# Patient Record
Sex: Male | Born: 1955 | Race: White | Hispanic: No | Marital: Married | Smoking: Former smoker
Health system: Southern US, Community
[De-identification: ages and names within clinical notes are randomized; demographics above are authoritative.]

## PROBLEM LIST (undated history)

## (undated) DIAGNOSIS — B019 Varicella without complication: Secondary | ICD-10-CM

## (undated) DIAGNOSIS — J939 Pneumothorax, unspecified: Secondary | ICD-10-CM

## (undated) DIAGNOSIS — E785 Hyperlipidemia, unspecified: Secondary | ICD-10-CM

## (undated) HISTORY — DX: Hyperlipidemia, unspecified: E78.5

## (undated) HISTORY — DX: Pneumothorax, unspecified: J93.9

## (undated) HISTORY — DX: Varicella without complication: B01.9

---

## 2005-02-05 ENCOUNTER — Inpatient Hospital Stay: Payer: Self-pay | Admitting: Surgery

## 2006-09-01 ENCOUNTER — Observation Stay: Payer: Self-pay | Admitting: Surgery

## 2006-09-05 ENCOUNTER — Ambulatory Visit: Payer: Self-pay | Admitting: Surgery

## 2006-12-23 IMAGING — CR DG CHEST 1V PORT
1 series · 1 of 1 positions shown · non-contrast
Comparison: none

REASON FOR EXAM: Chest tube placement. [HOSPITAL]
COMMENTS:

[view not recorded]
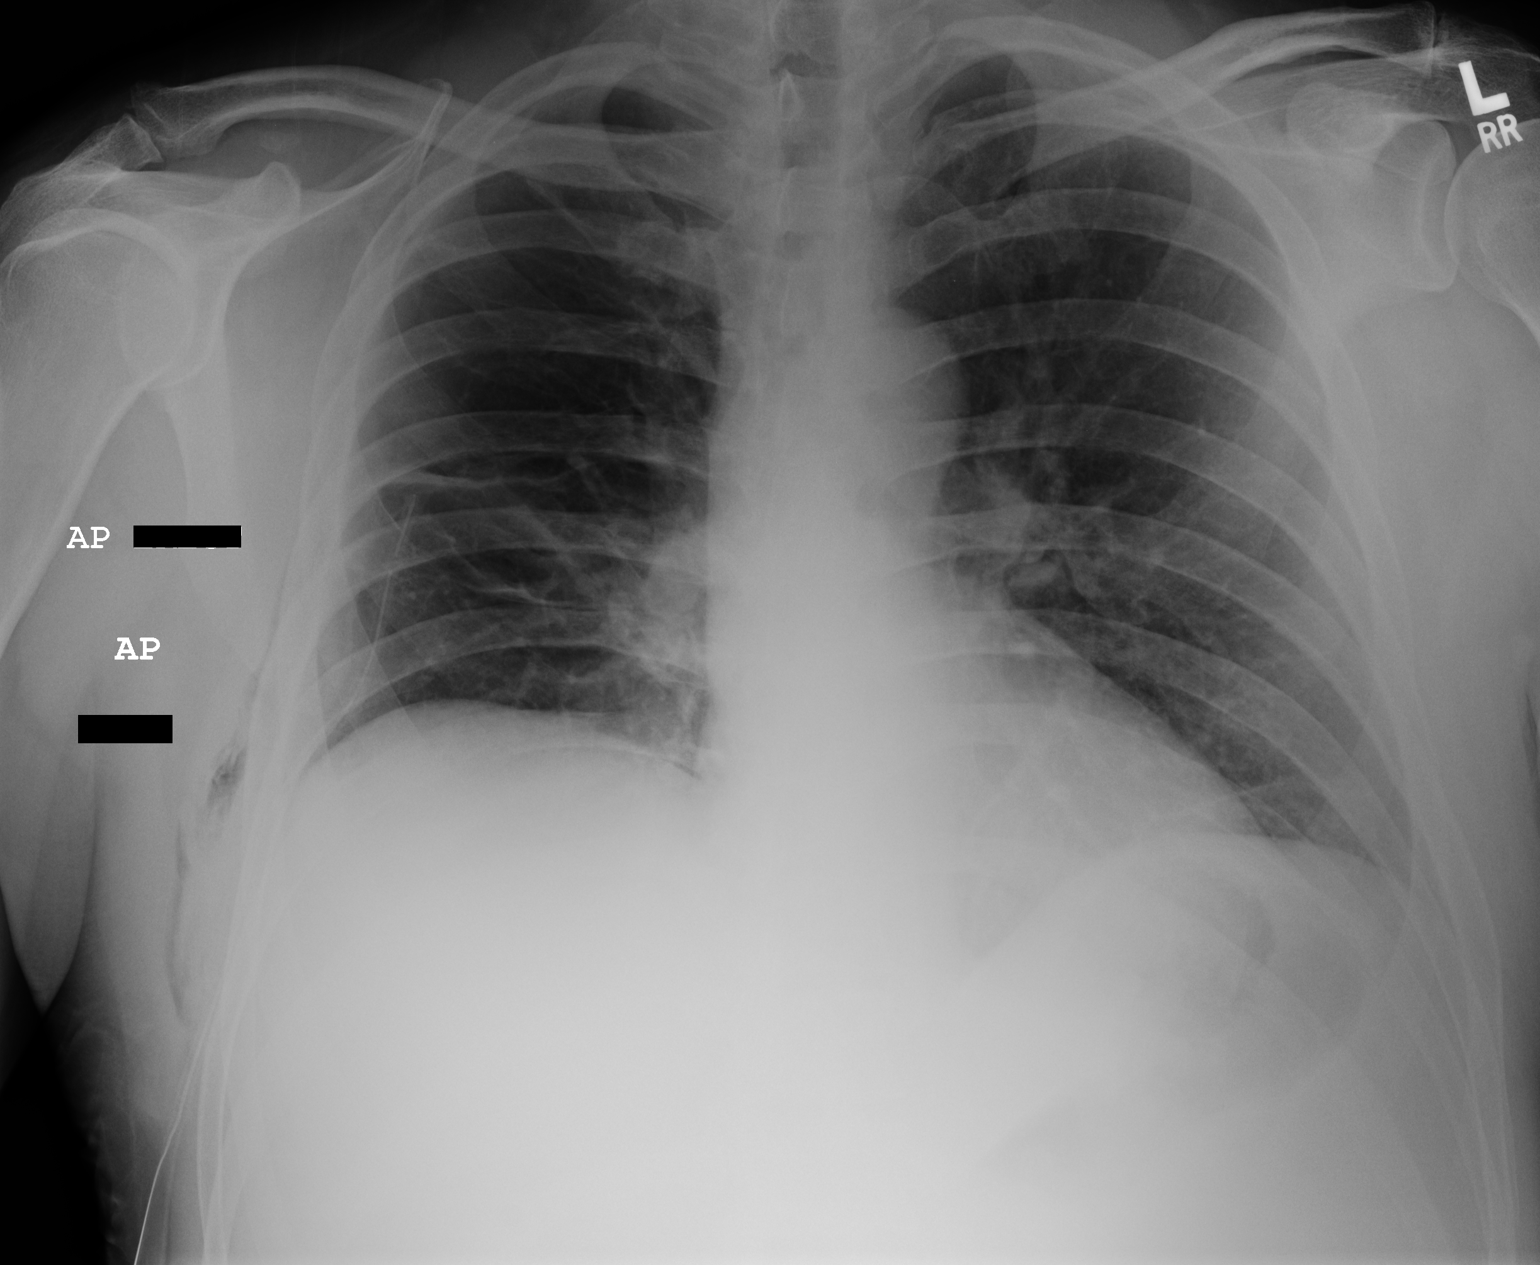

[1 of 1 positions shown; findings below may reference images not displayed]

PROCEDURE:     DXR - DXR PORTABLE CHEST SINGLE VIEW  - September 01, 2006  [DATE]

RESULT:     Note this study is labeled wrong on the images. The name on the
image should read [facility]el[facility].

The patient is taking a shallow inspiration.  A RIGHT-sided chest tube has
been placed within the RIGHT lower hemithorax.  There does not appear to be
evidence of an appreciable pneumothorax.  The patient is taking a shallow
inspiration.  No evidence of focal infiltrates, effusions, or edema is
identified.  Discoid atelectasis is demonstrated within the mid and lower
RIGHT hemithorax.  The cardiac silhouette and visualized bony skeleton are
unremarkable.
IMPRESSION: No evidence of a significant pneumothorax within the RIGHT hemithorax.

Discoid atelectasis versus scar within the RIGHT lung base.

RIGHT-sided chest tube.

## 2007-01-17 ENCOUNTER — Inpatient Hospital Stay: Payer: Self-pay | Admitting: Surgery

## 2007-01-27 ENCOUNTER — Ambulatory Visit: Payer: Self-pay | Admitting: Surgery

## 2007-05-10 IMAGING — CR DG CHEST 2V
1 series · 2 of 2 positions shown · non-contrast
Comparison: none

REASON FOR EXAM: PTX
COMMENTS:

[Series 1: view not recorded · 0.17mm/px · 2 of 2 slices shown]
[im 1/2]
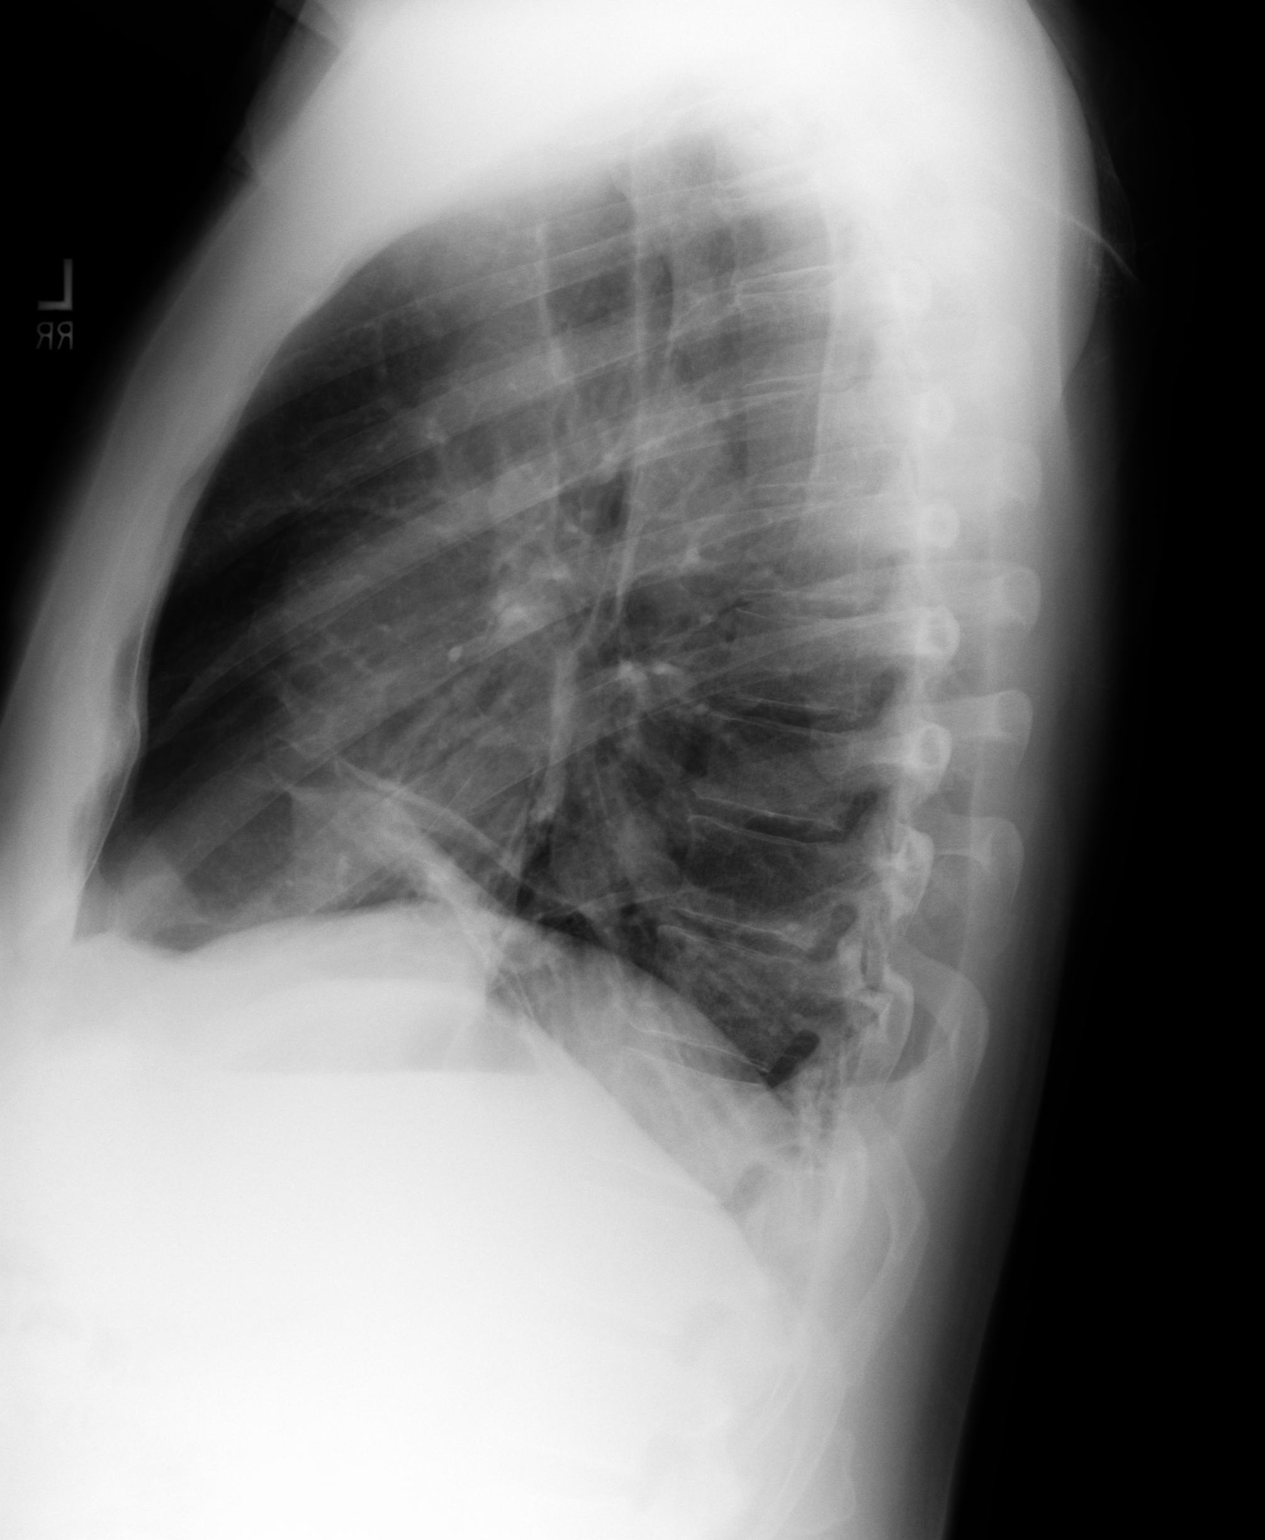
[im 2/2]
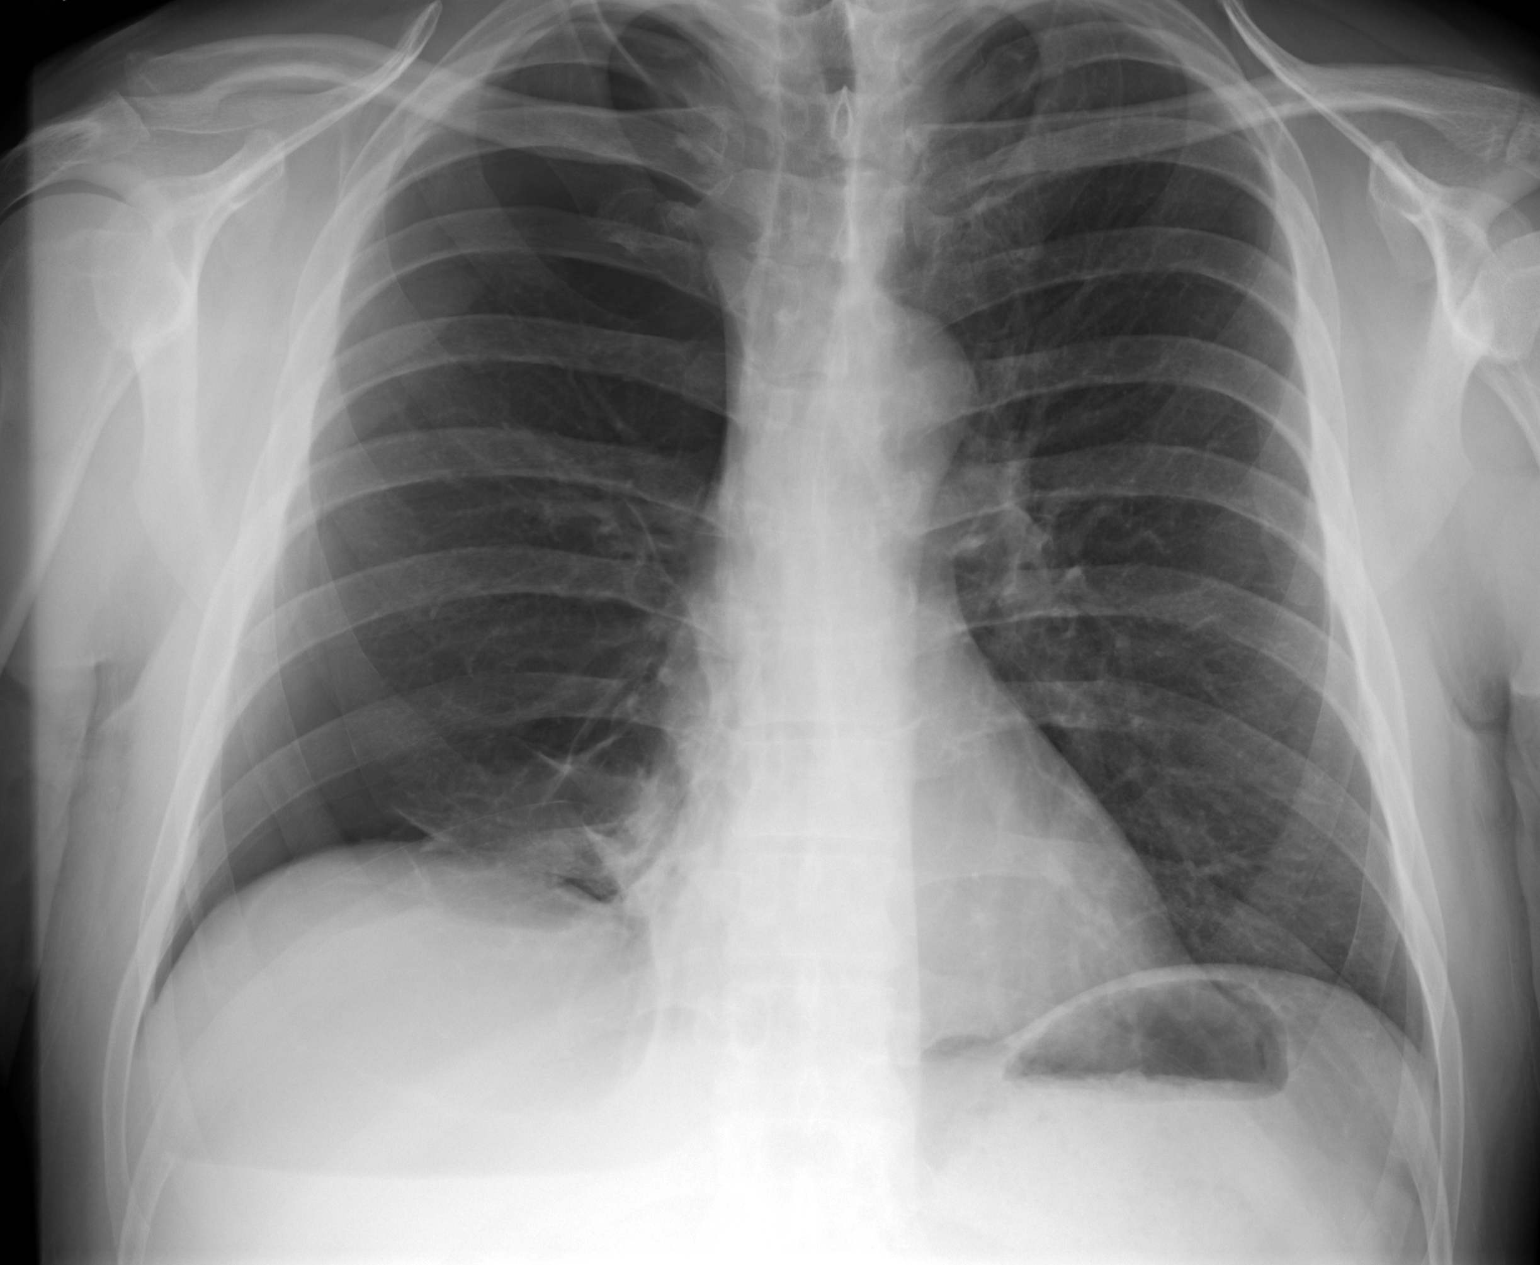

[2 of 2 positions shown; findings below may reference images not displayed]

PROCEDURE:     DXR - DXR CHEST PA (OR AP) AND LATERAL  - January 17, 2007  [DATE]

RESULT:     The current exam is compared to a prior exam of 09/05/06.  A
pneumothorax is observed on the RIGHT.  There is approximately 5 cm
separation of the visceral and parietal pleura at the RIGHT costophrenic
angle.  There is also approximately 5 cm separation of visceral and parietal
pleura at the RIGHT apex.  There is observed atelectasis at the RIGHT lung
base.  The LEFT lung field is clear.  Heart size is normal.
IMPRESSION: There is observed pneumothorax on the RIGHT as described above.

The LEFT lung field is clear.

## 2007-05-20 IMAGING — CR DG CHEST 2V
1 series · 2 of 2 positions shown · non-contrast
Comparison: none

REASON FOR EXAM: RECURRENT PNEUMOTHORAX W/CHEST TUBE PLACEMENT
COMMENTS:

[Series 1: view not recorded · 0.17mm/px · 2 of 2 slices shown]
[im 1/2]
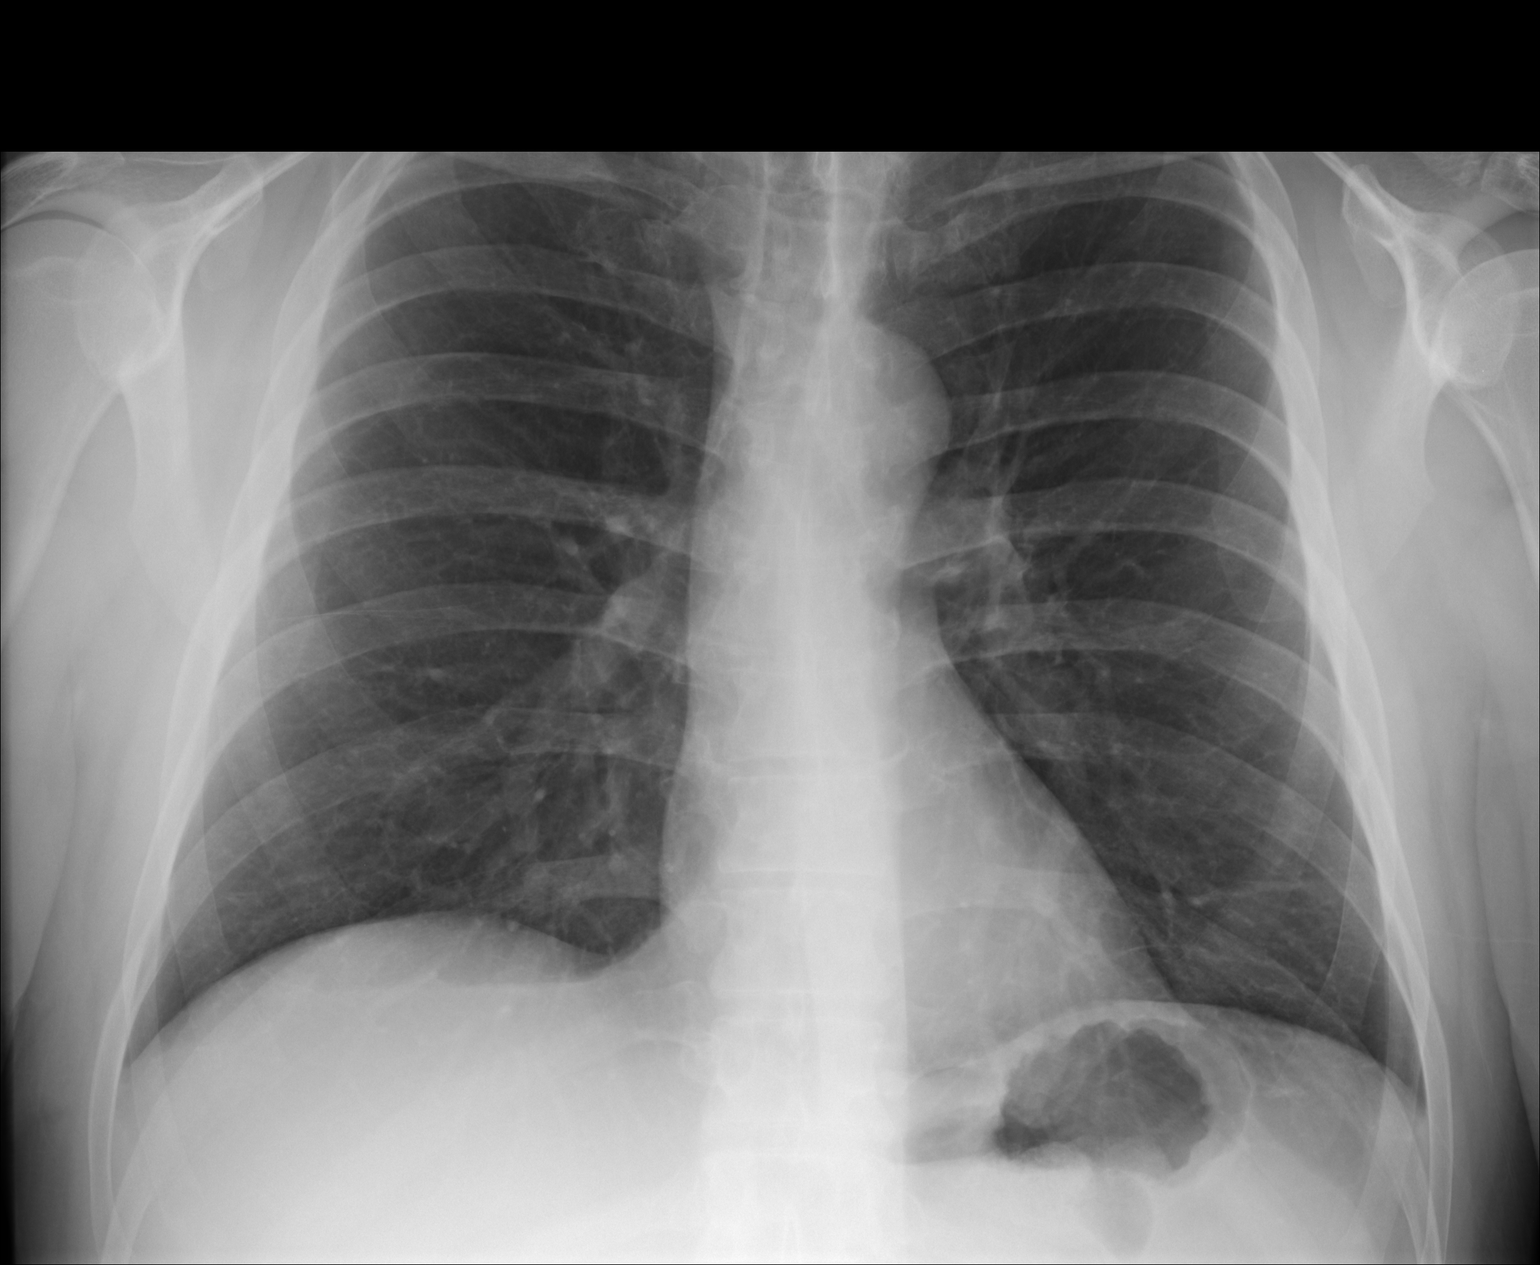
[im 2/2]
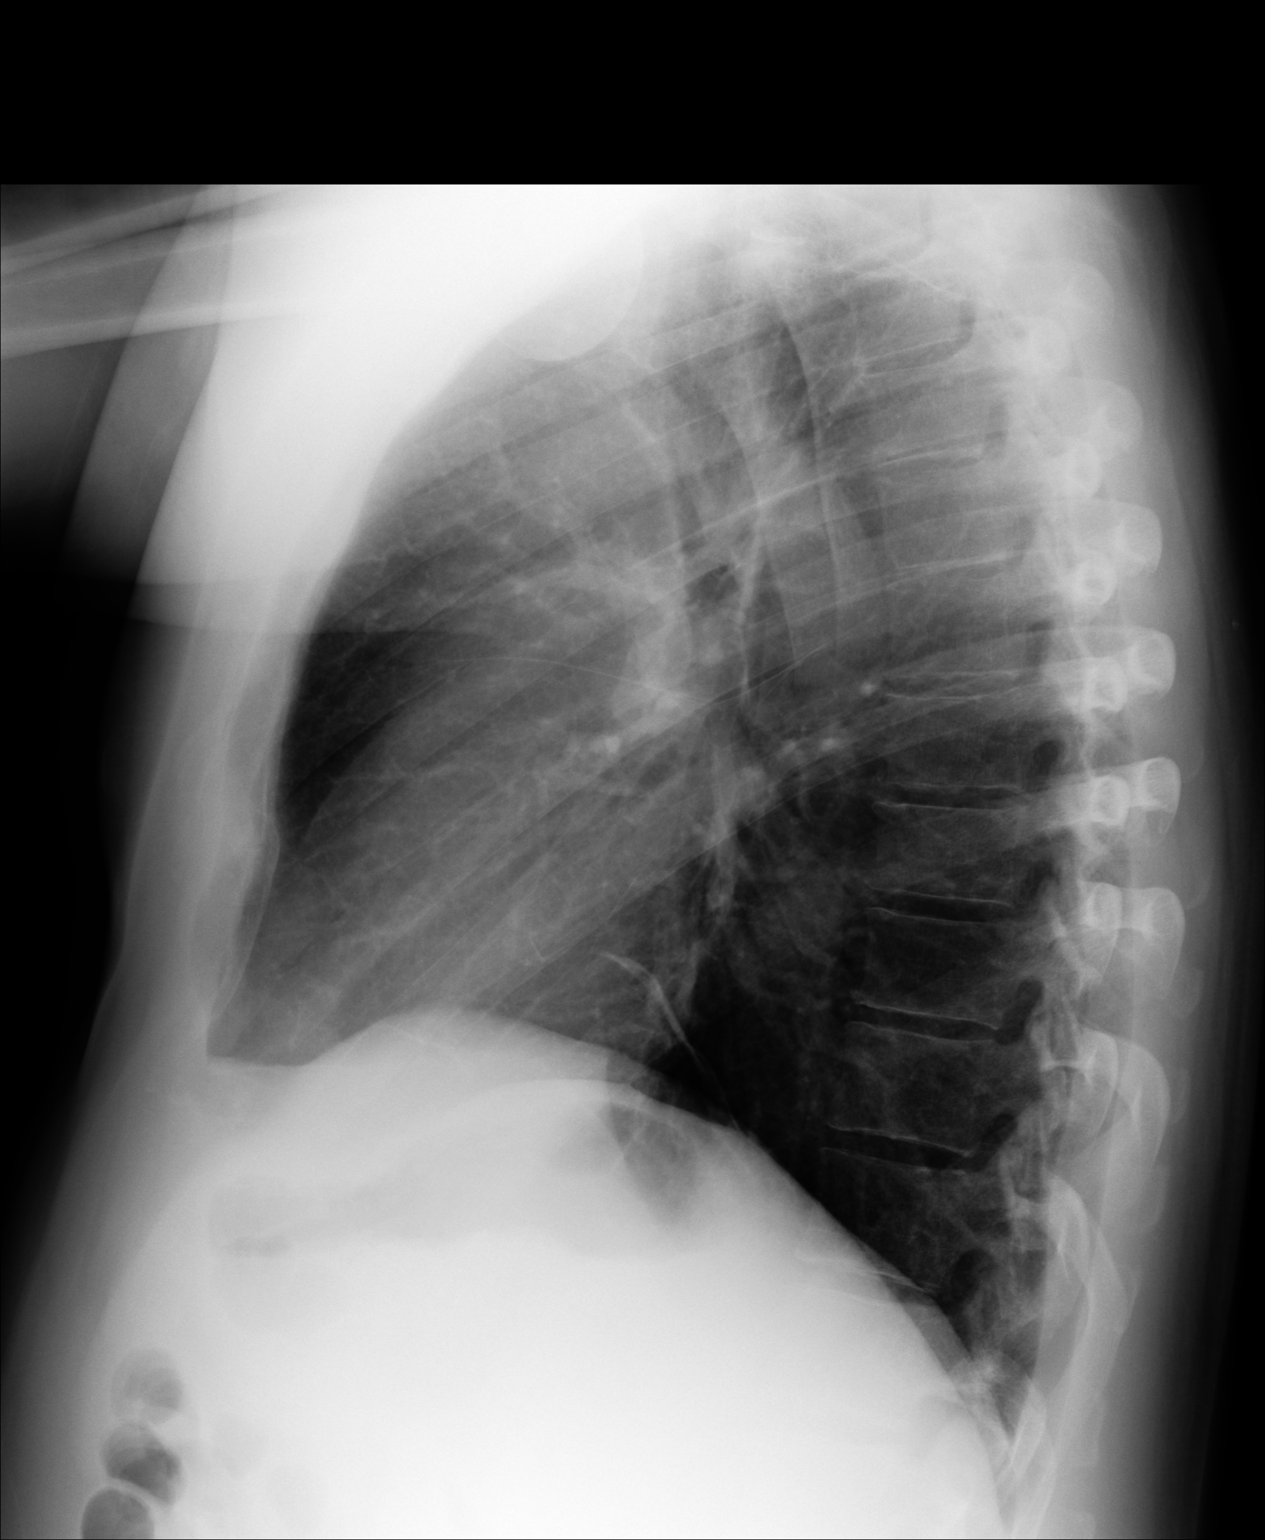

[2 of 2 positions shown; findings below may reference images not displayed]

PROCEDURE:     DXR - DXR CHEST PA (OR AP) AND LATERAL  - January 27, 2007 [DATE]

RESULT:     The patient has a history of recurrent pneumothoraces.  The
patient is undergoing reevaluation following placement of a chest tube on
01/17/07.

The lungs are well expanded.  I do not see evidence of recurrent
pneumothorax.  No definite chest tube is visible on today's study.
IMPRESSION: I do not see evidence of recurrence of the RIGHT-sided pneumothorax.  The
heart and mediastinal structures and LEFT lung are normal in appearance.

## 2017-09-12 ENCOUNTER — Encounter: Payer: Self-pay | Admitting: Family

## 2017-09-12 ENCOUNTER — Ambulatory Visit (INDEPENDENT_AMBULATORY_CARE_PROVIDER_SITE_OTHER): Payer: PRIVATE HEALTH INSURANCE | Admitting: Family

## 2017-09-12 VITALS — BP 140/84 | HR 67 | Temp 97.7°F | Wt 242.6 lb

## 2017-09-12 DIAGNOSIS — R030 Elevated blood-pressure reading, without diagnosis of hypertension: Secondary | ICD-10-CM | POA: Insufficient documentation

## 2017-09-12 DIAGNOSIS — Z7689 Persons encountering health services in other specified circumstances: Secondary | ICD-10-CM | POA: Diagnosis not present

## 2017-09-12 DIAGNOSIS — Z0000 Encounter for general adult medical examination without abnormal findings: Secondary | ICD-10-CM | POA: Insufficient documentation

## 2017-09-12 LAB — COMPREHENSIVE METABOLIC PANEL
ALBUMIN: 4.2 g/dL (ref 3.5–5.2)
ALK PHOS: 72 U/L (ref 39–117)
ALT: 19 U/L (ref 0–53)
AST: 16 U/L (ref 0–37)
BILIRUBIN TOTAL: 1.8 mg/dL — AB (ref 0.2–1.2)
BUN: 21 mg/dL (ref 6–23)
CALCIUM: 9.4 mg/dL (ref 8.4–10.5)
CO2: 27 meq/L (ref 19–32)
CREATININE: 0.91 mg/dL (ref 0.40–1.50)
Chloride: 104 mEq/L (ref 96–112)
GFR: 90.22 mL/min (ref >60.00)
Glucose, Bld: 98 mg/dL (ref 70–99)
Potassium: 4.4 mEq/L (ref 3.5–5.1)
Sodium: 138 mEq/L (ref 135–145)
Total Protein: 6.7 g/dL (ref 6.0–8.3)

## 2017-09-12 LAB — CBC WITH DIFFERENTIAL/PLATELET
BASOS ABS: 0 10*3/uL (ref 0.0–0.1)
BASOS PCT: 0.6 % (ref 0.0–3.0)
Eosinophils Absolute: 0.2 10*3/uL (ref 0.0–0.7)
Eosinophils Relative: 2.6 % (ref 0.0–5.0)
HEMATOCRIT: 47.9 % (ref 39.0–52.0)
HEMOGLOBIN: 15.8 g/dL (ref 13.0–17.0)
LYMPHS PCT: 21.5 % (ref 12.0–46.0)
Lymphs Abs: 1.3 10*3/uL (ref 0.7–4.0)
MCHC: 32.9 g/dL (ref 30.0–36.0)
MCV: 93 fl (ref 78.0–100.0)
MONOS PCT: 9.6 % (ref 3.0–12.0)
Monocytes Absolute: 0.6 10*3/uL (ref 0.1–1.0)
NEUTROS ABS: 4 10*3/uL (ref 1.4–7.7)
Neutrophils Relative %: 65.7 % (ref 43.0–77.0)
PLATELETS: 241 10*3/uL (ref 150.0–400.0)
RBC: 5.15 Mil/uL (ref 4.22–5.81)
RDW: 13.7 % (ref 11.5–15.5)
WBC: 6.1 10*3/uL (ref 4.0–10.5)

## 2017-09-12 LAB — LIPID PANEL
CHOL/HDL RATIO: 4
CHOLESTEROL: 241 mg/dL — AB (ref 0–200)
HDL: 54.5 mg/dL (ref >39.00)
NonHDL: 186.44
TRIGLYCERIDES: 283 mg/dL — AB (ref 0.0–149.0)
VLDL: 56.6 mg/dL — ABNORMAL HIGH (ref 0.0–40.0)

## 2017-09-12 LAB — VITAMIN D 25 HYDROXY (VIT D DEFICIENCY, FRACTURES): VITD: 25.25 ng/mL — AB (ref 30.00–100.00)

## 2017-09-12 LAB — TSH: TSH: 2.2 u[IU]/mL (ref 0.35–4.50)

## 2017-09-12 LAB — PSA: PSA: 0.43 ng/mL (ref 0.10–4.00)

## 2017-09-12 LAB — HEMOGLOBIN A1C: HEMOGLOBIN A1C: 5.2 % (ref 4.6–6.5)

## 2017-09-12 LAB — LDL CHOLESTEROL, DIRECT: LDL DIRECT: 135 mg/dL

## 2017-09-12 NOTE — Assessment & Plan Note (Signed)
CPE labs ordered. Will return for cpe. Reviewed PMH.

## 2017-09-12 NOTE — Patient Instructions (Signed)
Labs today  Monitor blood pressure,  Goal is less than 130/80; if persistently higher, please make sooner follow up appointment so we can recheck you blood pressure and manage medications  Return for physical exam   Managing Your Hypertension Hypertension is commonly called high blood pressure. This is when the force of your blood pressing against the walls of your arteries is too strong. Arteries are blood vessels that carry blood from your heart throughout your body. Hypertension forces the heart to work harder to pump blood, and may cause the arteries to become narrow or stiff. Having untreated or uncontrolled hypertension can cause heart attack, stroke, kidney disease, and other problems. What are blood pressure readings? A blood pressure reading consists of a higher number over a lower number. Ideally, your blood pressure should be below 120/80. The first ("top") number is called the systolic pressure. It is a measure of the pressure in your arteries as your heart beats. The second ("bottom") number is called the diastolic pressure. It is a measure of the pressure in your arteries as the heart relaxes. What does my blood pressure reading mean? Blood pressure is classified into four stages. Based on your blood pressure reading, your health care provider may use the following stages to determine what type of treatment you need, if any. Systolic pressure and diastolic pressure are measured in a unit called mm Hg. Normal  Systolic pressure: below 120.  Diastolic pressure: below 80. Elevated  Systolic pressure: 120-129.  Diastolic pressure: below 80. Hypertension stage 1  Systolic pressure: 130-139.  Diastolic pressure: 80-89. Hypertension stage 2  Systolic pressure: 140 or above.  Diastolic pressure: 90 or above. What health risks are associated with hypertension? Managing your hypertension is an important responsibility. Uncontrolled hypertension can lead to:  A heart  attack.  A stroke.  A weakened blood vessel (aneurysm).  Heart failure.  Kidney damage.  Eye damage.  Metabolic syndrome.  Memory and concentration problems.  What changes can I make to manage my hypertension? Hypertension can be managed by making lifestyle changes and possibly by taking medicines. Your health care provider will help you make a plan to bring your blood pressure within a normal range. Eating and drinking  Eat a diet that is high in fiber and potassium, and low in salt (sodium), added sugar, and fat. An example eating plan is called the DASH (Dietary Approaches to Stop Hypertension) diet. To eat this way: ? Eat plenty of fresh fruits and vegetables. Try to fill half of your plate at each meal with fruits and vegetables. ? Eat whole grains, such as whole wheat pasta, brown rice, or whole grain bread. Fill about one quarter of your plate with whole grains. ? Eat low-fat diary products. ? Avoid fatty cuts of meat, processed or cured meats, and poultry with skin. Fill about one quarter of your plate with lean proteins such as fish, chicken without skin, beans, eggs, and tofu. ? Avoid premade and processed foods. These tend to be higher in sodium, added sugar, and fat.  Reduce your daily sodium intake. Most people with hypertension should eat less than 1,500 mg of sodium a day.  Limit alcohol intake to no more than 1 drink a day for nonpregnant women and 2 drinks a day for men. One drink equals 12 oz of beer, 5 oz of wine, or 1 oz of hard liquor. Lifestyle  Work with your health care provider to maintain a healthy body weight, or to lose weight. Ask what  an ideal weight is for you.  Get at least 30 minutes of exercise that causes your heart to beat faster (aerobic exercise) most days of the week. Activities may include walking, swimming, or biking.  Include exercise to strengthen your muscles (resistance exercise), such as weight lifting, as part of your weekly exercise  routine. Try to do these types of exercises for 30 minutes at least 3 days a week.  Do not use any products that contain nicotine or tobacco, such as cigarettes and e-cigarettes. If you need help quitting, ask your health care provider.  Control any long-term (chronic) conditions you have, such as high cholesterol or diabetes. Monitoring  Monitor your blood pressure at home as told by your health care provider. Your personal target blood pressure may vary depending on your medical conditions, your age, and other factors.  Have your blood pressure checked regularly, as often as told by your health care provider. Working with your health care provider  Review all the medicines you take with your health care provider because there may be side effects or interactions.  Talk with your health care provider about your diet, exercise habits, and other lifestyle factors that may be contributing to hypertension.  Visit your health care provider regularly. Your health care provider can help you create and adjust your plan for managing hypertension. Will I need medicine to control my blood pressure? Your health care provider may prescribe medicine if lifestyle changes are not enough to get your blood pressure under control, and if:  Your systolic blood pressure is 130 or higher.  Your diastolic blood pressure is 80 or higher.  Take medicines only as told by your health care provider. Follow the directions carefully. Blood pressure medicines must be taken as prescribed. The medicine does not work as well when you skip doses. Skipping doses also puts you at risk for problems. Contact a health care provider if:  You think you are having a reaction to medicines you have taken.  You have repeated (recurrent) headaches.  You feel dizzy.  You have swelling in your ankles.  You have trouble with your vision. Get help right away if:  You develop a severe headache or confusion.  You have unusual  weakness or numbness, or you feel faint.  You have severe pain in your chest or abdomen.  You vomit repeatedly.  You have trouble breathing. Summary  Hypertension is when the force of blood pumping through your arteries is too strong. If this condition is not controlled, it may put you at risk for serious complications.  Your personal target blood pressure may vary depending on your medical conditions, your age, and other factors. For most people, a normal blood pressure is less than 120/80.  Hypertension is managed by lifestyle changes, medicines, or both. Lifestyle changes include weight loss, eating a healthy, low-sodium diet, exercising more, and limiting alcohol. This information is not intended to replace advice given to you by your health care provider. Make sure you discuss any questions you have with your health care provider. Document Released: 09/10/2012 Document Revised: 11/14/2016 Document Reviewed: 11/14/2016 Elsevier Interactive Patient Education  [employer]2018 [employer]

## 2017-09-12 NOTE — Progress Notes (Signed)
Subjective:    Patient ID: Edwin Parker, male    DOB: [birth year], 61 y.o.   MRN: [id number]  CC: Edwin Parker is a 61 y.o. male who presents today to establish care.    HPI: Hasn't had pcp in 2 years.   Feeling well.   No h/o HTN. Eats a lot of salt. Denies exertional chest pain or pressure, numbness or tingling radiating to left arm or jaw, palpitations, dizziness, frequent headaches, changes in vision, or shortness of breath.   No smoking- quit when 61 years old  4-5 beers per day. No one has ever asked him to cut back. Doesn't drink an eye opening.    Colonoscopy 2 years ago, per patient due 2021  HISTORY:  Past Medical History:  Diagnosis Date  . Chicken pox   . Hyperlipidemia   . Pneumothorax 25366   History reviewed. No pertinent surgical history. Family History  Problem Relation Age of Onset  . Hyperlipidemia Mother   . Heart disease Mother   . Alcohol abuse Father   . Early death Brother        cancer  . Cancer Maternal Grandmother        ovarian/breast    Allergies: Patient has no allergy information on record. No current outpatient prescriptions on file prior to visit.   No current facility-administered medications on file prior to visit.     Social History  Substance Use Topics  . Smoking status: Former Games developer  . Smokeless tobacco: Former Neurosurgeon  . Alcohol use Yes    Review of Systems  Constitutional: Negative for chills and fever.  Respiratory: Negative for cough and shortness of breath.   Cardiovascular: Negative for chest pain and palpitations.  Gastrointestinal: Negative for nausea and vomiting.  Neurological: Negative for headaches.      Objective:    BP 140/84   Pulse 67   Temp 97.7 F (36.5 C) (Oral)   Wt 242 lb 9.6 oz (110 kg)   SpO2 98%  BP Readings from Last 3 Encounters:  09/12/17 140/84   Wt Readings from Last 3 Encounters:  09/12/17 242 lb 9.6 oz (110 kg)    Physical Exam  Constitutional: He appears  well-developed and well-nourished.  Cardiovascular: Regular rhythm and normal heart sounds.   Pulmonary/Chest: Effort normal and breath sounds normal. No respiratory distress. He has no wheezes. He has no rhonchi. He has no rales.  Neurological: He is alert.  Skin: Skin is warm and dry.  Psychiatric: He has a normal mood and affect. His speech is normal and behavior is normal.  Vitals reviewed.      Assessment & Plan:   Problem List Items Addressed This Visit      Other   Encounter to establish care - Primary    CPE labs ordered. Will return for cpe. Reviewed PMH.       Relevant Orders   CBC with Differential/Platelet   Comprehensive metabolic panel   Hemoglobin A1c   Lipid panel   TSH   VITAMIN D 25 Hydroxy (Vit-D Deficiency, Fractures)   PSA   Hepatitis C antibody   HIV antibody   Elevated blood pressure reading    Slightly elevated. Discussed DASH diet, lifestyle modifications. Patient will monitor at home. We'll recheck blood pressure at physical.           [name] does not currently have medications on file.   No orders of the defined types were placed in this encounter.  Return precautions given.   Risks, benefits, and alternatives of the medications and treatment plan prescribed today were discussed, and patient expressed understanding.   Education regarding symptom management and diagnosis given to patient on AVS.  Continue to follow with [name], Margaret G, FNP for routine health maintenance.   [name] and I agreed with plan.   Rennie PlowmanMargaret Arnett, FNP

## 2017-09-12 NOTE — Assessment & Plan Note (Signed)
Slightly elevated. Discussed DASH diet, lifestyle modifications. Patient will monitor at home. We'll recheck blood pressure at physical.

## 2017-09-13 LAB — HEPATITIS C ANTIBODY
HEP C AB: NONREACTIVE
SIGNAL TO CUT-OFF: 0.08 (ref <1.00)

## 2017-09-13 LAB — HIV ANTIBODY (ROUTINE TESTING W REFLEX): HIV 1&2 Ab, 4th Generation: NONREACTIVE

## 2017-09-16 ENCOUNTER — Telehealth: Payer: Self-pay

## 2017-09-16 ENCOUNTER — Other Ambulatory Visit: Payer: Self-pay | Admitting: Family

## 2017-09-16 DIAGNOSIS — E785 Hyperlipidemia, unspecified: Secondary | ICD-10-CM | POA: Insufficient documentation

## 2017-09-16 MED ORDER — PRAVASTATIN SODIUM 40 MG PO TABS: 40.0000 mg | ORAL_TABLET | Freq: Every day | ORAL | 2 refills | Status: AC

## 2017-09-16 NOTE — Telephone Encounter (Signed)
Error

## 2017-10-08 ENCOUNTER — Encounter: Payer: PRIVATE HEALTH INSURANCE | Admitting: Family

## 2017-10-28 ENCOUNTER — Encounter: Payer: Self-pay | Admitting: Family

## 2017-10-28 ENCOUNTER — Ambulatory Visit (INDEPENDENT_AMBULATORY_CARE_PROVIDER_SITE_OTHER): Payer: PRIVATE HEALTH INSURANCE | Admitting: Family

## 2017-10-28 VITALS — BP 132/82 | HR 66 | Temp 97.6°F | Ht 76.0 in | Wt 245.0 lb

## 2017-10-28 DIAGNOSIS — Z0000 Encounter for general adult medical examination without abnormal findings: Secondary | ICD-10-CM

## 2017-10-28 DIAGNOSIS — R17 Unspecified jaundice: Secondary | ICD-10-CM | POA: Diagnosis not present

## 2017-10-28 NOTE — Progress Notes (Signed)
Pre visit review using our clinic review tool, if applicable. No additional management support is needed unless otherwise documented below in the visit note. 

## 2017-10-28 NOTE — Patient Instructions (Addendum)
tdap ( tetanus) vaccine at local pharmacy  Labs today   Pleasure seeing you!   Health Maintenance, Male A healthy lifestyle and preventive care is important for your health and wellness. Ask your health care provider about what schedule of regular examinations is right for you. What should I know about weight and diet? Eat a Healthy Diet  Eat plenty of vegetables, fruits, whole grains, low-fat dairy products, and lean protein.  Do not eat a lot of foods high in solid fats, added sugars, or salt.  Maintain a Healthy Weight Regular exercise can help you achieve or maintain a healthy weight. You should:  Do at least 150 minutes of exercise each week. The exercise should increase your heart rate and make you sweat (moderate-intensity exercise).  Do strength-training exercises at least twice a week.  Watch Your Levels of Cholesterol and Blood Lipids  Have your blood tested for lipids and cholesterol every 5 years starting at 60 years of age. If you are at high risk for heart disease, you should start having your blood tested when you are 60 years old. You may need to have your cholesterol levels checked more often if: ? Your lipid or cholesterol levels are high. ? You are older than 60 years of age. ? You are at high risk for heart disease.  What should I know about cancer screening? Many types of cancers can be detected early and may often be prevented. Lung Cancer  You should be screened every year for lung cancer if: ? You are a current smoker who has smoked for at least 30 years. ? You are a former smoker who has quit within the past 15 years.  Talk to your health care provider about your screening options, when you should start screening, and how often you should be screened.  Colorectal Cancer  Routine colorectal cancer screening usually begins at 60 years of age and should be repeated every 5-10 years until you are 60 years old. You may need to be screened more often if  early forms of precancerous polyps or small growths are found. Your health care provider may recommend screening at an earlier age if you have risk factors for colon cancer.  Your health care provider may recommend using home test kits to check for hidden blood in the stool.  A small camera at the end of a tube can be used to examine your colon (sigmoidoscopy or colonoscopy). This checks for the earliest forms of colorectal cancer.  Prostate and Testicular Cancer  Depending on your age and overall health, your health care provider may do certain tests to screen for prostate and testicular cancer.  Talk to your health care provider about any symptoms or concerns you have about testicular or prostate cancer.  Skin Cancer  Check your skin from head to toe regularly.  Tell your health care provider about any new moles or changes in moles, especially if: ? There is a change in a mole's size, shape, or color. ? You have a mole that is larger than a pencil eraser.  Always use sunscreen. Apply sunscreen liberally and repeat throughout the day.  Protect yourself by wearing long sleeves, pants, a wide-brimmed hat, and sunglasses when outside.  What should I know about heart disease, diabetes, and high blood pressure?  If you are 51-32 years of age, have your blood pressure checked every 3-5 years. If you are 21 years of age or older, have your blood pressure checked every year. You should  have your blood pressure measured twice-once when you are at a hospital or clinic, and once when you are not at a hospital or clinic. Record the average of the two measurements. To check your blood pressure when you are not at a hospital or clinic, you can use: ? An automated blood pressure machine at a pharmacy. ? A home blood pressure monitor.  Talk to your health care provider about your target blood pressure.  If you are between 6245-60 years old, ask your health care provider if you should take aspirin to  prevent heart disease.  Have regular diabetes screenings by checking your fasting blood sugar level. ? If you are at a normal weight and have a low risk for diabetes, have this test once every three years after the age of 60. ? If you are overweight and have a high risk for diabetes, consider being tested at a younger age or more often.  A one-time screening for abdominal aortic aneurysm (AAA) by ultrasound is recommended for men aged 65-75 years who are current or former smokers. What should I know about preventing infection? Hepatitis B If you have a higher risk for hepatitis B, you should be screened for this virus. Talk with your health care provider to find out if you are at risk for hepatitis B infection. Hepatitis C Blood testing is recommended for:  Everyone born from [id number] through 1964.  Anyone with known risk factors for hepatitis C.  Sexually Transmitted Diseases (STDs)  You should be screened each year for STDs including gonorrhea and chlamydia if: ? You are sexually active and are younger than 60 years of age. ? You are older than 60 years of age and your health care provider tells you that you are at risk for this type of infection. ? Your sexual activity has changed since you were last screened and you are at an increased risk for chlamydia or gonorrhea. Ask your health care provider if you are at risk.  Talk with your health care provider about whether you are at high risk of being infected with HIV. Your health care provider may recommend a prescription medicine to help prevent HIV infection.  What else can I do?  Schedule regular health, dental, and eye exams.  Stay current with your vaccines (immunizations).  Do not use any tobacco products, such as cigarettes, chewing tobacco, and e-cigarettes. If you need help quitting, ask your health care provider.  Limit alcohol intake to no more than 2 drinks per day. One drink equals 12 ounces of beer, 5 ounces of wine, or  1 ounces of hard liquor.  Do not use street drugs.  Do not share needles.  Ask your health care provider for help if you need support or information about quitting drugs.  Tell your health care provider if you often feel depressed.  Tell your health care provider if you have ever been abused or do not feel safe at home. This information is not intended to replace advice given to you by your health care provider. Make sure you discuss any questions you have with your health care provider. Document Released: 06/14/2008 Document Revised: 08/15/2016 Document Reviewed: 09/20/2015 Elsevier Interactive Patient Education  [employer]2018 [employer]

## 2017-10-28 NOTE — Progress Notes (Signed)
Subjective:    Patient ID: Edwin Parker, male    DOB: 1956-07-12, 61 y.o.   MRN: [id number]  CC: Edwin Parker is a 61 y.o. male who presents today for physical exam.    HPI: feeling well today. No complaints. Tolerating cholesterol medication.  Known bilrubun elevatin. No ruq pain.  'whole family's bilirubin is high.'   Has been dieting - has quit 'grazing' and intentionally lost weight.  No more beer.     Colorectal  Cancer Screening: UTD , due in 2021 Prostate Cancer Screening: PSA Screened prior.  Lung Cancer Screening: No 30 year pack year history and > 55 years. Smoked 12 years.  Immunizations       Tetanus - due         Labs: Screening labs today. Exercise: Gets regular exercise.  Alcohol use:  None Smoking/tobacco use: former smoker.  Regular dental exams: UTD Wears seat belt: Yes. Skin: no h/o skin cancer; no new lesions.  HISTORY:  Past Medical History:  Diagnosis Date  . Chicken pox   . Hyperlipidemia   . Pneumothorax 82956    No past surgical history on file. Family History  Problem Relation Age of Onset  . Hyperlipidemia Mother   . Heart disease Mother   . Alcohol abuse Father   . Early death Brother        cancer  . Cancer Maternal Grandmother        ovarian/breast      ALLERGIES: Patient has no allergy information on record.  Current Outpatient Prescriptions on File Prior to Visit  Medication Sig Dispense Refill  . pravastatin (PRAVACHOL) 40 MG tablet Take 1 tablet (40 mg total) by mouth daily. 90 tablet 2   No current facility-administered medications on file prior to visit.     Social History  Substance Use Topics  . Smoking status: Former Games developer  . Smokeless tobacco: Former Neurosurgeon  . Alcohol use Yes    Review of Systems  Constitutional: Negative for chills and fever.  HENT: Negative for congestion.   Respiratory: Negative for cough.   Cardiovascular: Negative for chest pain, palpitations and leg swelling.    Gastrointestinal: Negative for diarrhea, nausea and vomiting.  Genitourinary: Negative for difficulty urinating, penile swelling and testicular pain.  Musculoskeletal: Negative for myalgias.  Skin: Negative for rash.  Neurological: Negative for headaches.  Hematological: Negative for adenopathy.  Psychiatric/Behavioral: Negative for confusion.      Objective:    BP 132/82   Pulse 66   Temp 97.6 F (36.4 C) (Oral)   Ht 6\' 4"  (1.93 m)   Wt 245 lb (111.1 kg)   SpO2 97%   BMI 29.82 kg/m   BP Readings from Last 3 Encounters:  10/28/17 132/82  09/12/17 140/84   Wt Readings from Last 3 Encounters:  10/28/17 245 lb (111.1 kg)  09/12/17 242 lb 9.6 oz (110 kg)    Physical Exam  Constitutional: He appears well-developed and well-nourished.  Neck: No thyroid mass and no thyromegaly present.  Cardiovascular: Regular rhythm and normal heart sounds.   Pulmonary/Chest: Effort normal and breath sounds normal. No respiratory distress. He has no wheezes. He has no rhonchi. He has no rales.  Lymphadenopathy:       Head (right side): No submental, no submandibular, no tonsillar, no preauricular, no posterior auricular and no occipital adenopathy present.       Head (left side): No submental, no submandibular, no tonsillar, no preauricular, no posterior auricular and no  occipital adenopathy present.    He has no cervical adenopathy.    He has no axillary adenopathy.  Neurological: He is alert.  Skin: Skin is warm and dry.  Psychiatric: He has a normal mood and affect. His speech is normal and behavior is normal.  Vitals reviewed.      Assessment & Plan:   Problem List Items Addressed This Visit      Other   Routine physical examination - Primary    Advised tetanus. Declined prostate exam in the absence of symptoms. Congratulated on healthy lifestyle changes and weight loss.       Elevated bilirubin    Pending further labs; from history, working diagnosis of gilbert's syndrome.        Relevant Orders   Hepatic function panel       I am having Edwin Parker maintain his pravastatin.   No orders of the defined types were placed in this encounter.   Return precautions given.   Risks, benefits, and alternatives of the medications and treatment plan prescribed today were discussed, and patient expressed understanding.   Education regarding symptom management and diagnosis given to patient on AVS.   Continue to follow with Edwin Parker, Edwin Gassmann G, FNP for routine health maintenance.   Edwin Parker and I agreed with plan.   Edwin PlowmanMargaret Adreonna Yontz, FNP

## 2017-10-28 NOTE — Assessment & Plan Note (Addendum)
Advised tetanus. Declined prostate exam in the absence of symptoms. Congratulated on healthy lifestyle changes and weight loss.

## 2017-10-28 NOTE — Assessment & Plan Note (Signed)
Pending further labs; from history, working diagnosis of gilbert's syndrome.

## 2017-10-29 LAB — HEPATIC FUNCTION PANEL
ALBUMIN: 4.5 g/dL (ref 3.5–5.2)
ALK PHOS: 56 U/L (ref 39–117)
ALT: 21 U/L (ref 0–53)
AST: 19 U/L (ref 0–37)
BILIRUBIN TOTAL: 1.8 mg/dL — AB (ref 0.2–1.2)
Bilirubin, Direct: 0.2 mg/dL (ref 0.0–0.3)
Total Protein: 7.3 g/dL (ref 6.0–8.3)

## 2018-02-06 ENCOUNTER — Telehealth: Payer: Self-pay | Admitting: Family

## 2018-02-06 NOTE — Telephone Encounter (Signed)
Copied from CRM [phone]. Topic: Bill or Statement - Patient/Guarantor Inquiry >> Feb 05, 2018 11:45 AM Gari CrownWade, Vanessa F wrote: Patient name/MRN/Acct #: Edwin CriglerScott P. Rodocker DOS:10.29.18 with [facility] and 9.13.18 with [facility] Details of issue or inquiry: Patient is required to pay a co insurance /deductible on those dates of service. She will need to contact her insurance carrier if she thinks she is not required to pay a co insurance or deductible.   I spoke with the patient's wife she will call there insurance carrier to see why they did not cover her husbands physical at a 100%.   I called [facility] to change the patients lab dx code to Z00.00 labs that were done on 9.13.18 were supposed to have been attached to his physical.

## 2018-10-28 ENCOUNTER — Encounter: Payer: PRIVATE HEALTH INSURANCE | Admitting: Family

## 2018-10-29 ENCOUNTER — Encounter: Payer: PRIVATE HEALTH INSURANCE | Admitting: Family

## 2022-05-10 DIAGNOSIS — J019 Acute sinusitis, unspecified: Secondary | ICD-10-CM | POA: Diagnosis not present

## 2022-05-10 DIAGNOSIS — B9689 Other specified bacterial agents as the cause of diseases classified elsewhere: Secondary | ICD-10-CM | POA: Diagnosis not present

## 2022-07-17 DIAGNOSIS — J301 Allergic rhinitis due to pollen: Secondary | ICD-10-CM | POA: Diagnosis not present

## 2022-07-17 DIAGNOSIS — H903 Sensorineural hearing loss, bilateral: Secondary | ICD-10-CM | POA: Diagnosis not present

## 2022-07-17 DIAGNOSIS — J342 Deviated nasal septum: Secondary | ICD-10-CM | POA: Diagnosis not present

## 2022-07-17 DIAGNOSIS — J3489 Other specified disorders of nose and nasal sinuses: Secondary | ICD-10-CM | POA: Diagnosis not present

## 2022-11-27 DIAGNOSIS — B9689 Other specified bacterial agents as the cause of diseases classified elsewhere: Secondary | ICD-10-CM | POA: Diagnosis not present

## 2022-11-27 DIAGNOSIS — J019 Acute sinusitis, unspecified: Secondary | ICD-10-CM | POA: Diagnosis not present

## 2023-05-06 DIAGNOSIS — J019 Acute sinusitis, unspecified: Secondary | ICD-10-CM | POA: Diagnosis not present

## 2023-05-06 DIAGNOSIS — J209 Acute bronchitis, unspecified: Secondary | ICD-10-CM | POA: Diagnosis not present

## 2023-05-06 DIAGNOSIS — B9689 Other specified bacterial agents as the cause of diseases classified elsewhere: Secondary | ICD-10-CM | POA: Diagnosis not present

## 2023-05-06 DIAGNOSIS — H66003 Acute suppurative otitis media without spontaneous rupture of ear drum, bilateral: Secondary | ICD-10-CM | POA: Diagnosis not present

## 2023-05-15 DIAGNOSIS — Z0000 Encounter for general adult medical examination without abnormal findings: Secondary | ICD-10-CM | POA: Diagnosis not present

## 2023-05-15 DIAGNOSIS — Z125 Encounter for screening for malignant neoplasm of prostate: Secondary | ICD-10-CM | POA: Diagnosis not present

## 2023-05-15 DIAGNOSIS — Z1211 Encounter for screening for malignant neoplasm of colon: Secondary | ICD-10-CM | POA: Diagnosis not present

## 2023-05-15 DIAGNOSIS — E7800 Pure hypercholesterolemia, unspecified: Secondary | ICD-10-CM | POA: Diagnosis not present

## 2023-12-05 DIAGNOSIS — J019 Acute sinusitis, unspecified: Secondary | ICD-10-CM | POA: Diagnosis not present

## 2024-08-18 DIAGNOSIS — J019 Acute sinusitis, unspecified: Secondary | ICD-10-CM | POA: Diagnosis not present

## 2024-08-26 DIAGNOSIS — J329 Chronic sinusitis, unspecified: Secondary | ICD-10-CM | POA: Diagnosis not present
